# Patient Record
Sex: Male | Born: 1975 | Race: White | Hispanic: No | Marital: Married | Smoking: Never smoker
Health system: Southern US, Community
[De-identification: ages and names within clinical notes are randomized; demographics above are authoritative.]

## PROBLEM LIST (undated history)

## (undated) DIAGNOSIS — R51 Headache: Secondary | ICD-10-CM

## (undated) HISTORY — PX: NO PAST SURGERIES: SHX2092

---

## 2012-08-07 ENCOUNTER — Emergency Department (HOSPITAL_COMMUNITY)
Admission: EM | Admit: 2012-08-07 | Discharge: 2012-08-08 | Disposition: A | Discharge status: Home / Self Care | Payer: Self-pay | Attending: Emergency Medicine | Admitting: Emergency Medicine

## 2012-08-07 ENCOUNTER — Emergency Department (HOSPITAL_COMMUNITY): Payer: Self-pay

## 2012-08-07 ENCOUNTER — Encounter (HOSPITAL_COMMUNITY): Payer: Self-pay | Admitting: Emergency Medicine

## 2012-08-07 DIAGNOSIS — Y92410 Unspecified street and highway as the place of occurrence of the external cause: Secondary | ICD-10-CM | POA: Insufficient documentation

## 2012-08-07 DIAGNOSIS — S42001A Fracture of unspecified part of right clavicle, initial encounter for closed fracture: Secondary | ICD-10-CM

## 2012-08-07 DIAGNOSIS — Y9389 Activity, other specified: Secondary | ICD-10-CM | POA: Insufficient documentation

## 2012-08-07 DIAGNOSIS — S2239XA Fracture of one rib, unspecified side, initial encounter for closed fracture: Secondary | ICD-10-CM | POA: Insufficient documentation

## 2012-08-07 DIAGNOSIS — S42009A Fracture of unspecified part of unspecified clavicle, initial encounter for closed fracture: Secondary | ICD-10-CM | POA: Insufficient documentation

## 2012-08-07 DIAGNOSIS — S2231XA Fracture of one rib, right side, initial encounter for closed fracture: Secondary | ICD-10-CM

## 2012-08-07 MED ORDER — HYDROCODONE-ACETAMINOPHEN 5-325 MG PO TABS: 1.0000 | ORAL_TABLET | ORAL | Status: DC | PRN

## 2012-08-07 MED ORDER — MORPHINE SULFATE 4 MG/ML IJ SOLN
4.0000 mg | Freq: Once | INTRAMUSCULAR | Status: AC
  Administered 2012-08-07: 4 mg via INTRAVENOUS
  Filled 2012-08-07: qty 1
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
## 2012-08-13 ENCOUNTER — Encounter (HOSPITAL_COMMUNITY): Payer: Self-pay | Admitting: Pharmacy Technician

## 2012-08-13 ENCOUNTER — Encounter (HOSPITAL_COMMUNITY): Payer: Self-pay | Admitting: *Deleted
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
## 2012-08-14 ENCOUNTER — Ambulatory Visit (HOSPITAL_COMMUNITY): Payer: Medicaid Other

## 2012-08-14 ENCOUNTER — Encounter (HOSPITAL_COMMUNITY)
Admission: RE | Disposition: A | Discharge status: Home / Self Care | Payer: Self-pay | Source: Ambulatory Visit | Attending: Orthopedic Surgery

## 2012-08-14 ENCOUNTER — Ambulatory Visit (HOSPITAL_COMMUNITY)
Admission: RE | Admit: 2012-08-14 | Discharge: 2012-08-14 | Disposition: A | Discharge status: Home / Self Care | Payer: Medicaid Other | Source: Ambulatory Visit | Attending: Orthopedic Surgery | Admitting: Orthopedic Surgery

## 2012-08-14 ENCOUNTER — Encounter (HOSPITAL_COMMUNITY): Payer: Self-pay | Admitting: *Deleted

## 2012-08-14 ENCOUNTER — Ambulatory Visit (HOSPITAL_COMMUNITY): Payer: Medicaid Other | Admitting: Certified Registered Nurse Anesthetist

## 2012-08-14 ENCOUNTER — Encounter (HOSPITAL_COMMUNITY): Payer: Self-pay | Admitting: Certified Registered Nurse Anesthetist

## 2012-08-14 DIAGNOSIS — S42023A Displaced fracture of shaft of unspecified clavicle, initial encounter for closed fracture: Secondary | ICD-10-CM | POA: Insufficient documentation

## 2012-08-14 DIAGNOSIS — R51 Headache: Secondary | ICD-10-CM | POA: Insufficient documentation

## 2012-08-14 HISTORY — DX: Headache: R51

## 2012-08-14 HISTORY — PX: ORIF CLAVICULAR FRACTURE: SHX5055

## 2012-08-14 LAB — CBC
MCH: 29.3 pg (ref 26.0–34.0)
MCV: 81 fL (ref 78.0–100.0)
Platelets: 220 10*3/uL (ref 150–400)
RBC: 5.15 MIL/uL (ref 4.22–5.81)

## 2012-08-14 LAB — SURGICAL PCR SCREEN
MRSA, PCR: NEGATIVE
Staphylococcus aureus: NEGATIVE

## 2012-08-14 SURGERY — OPEN REDUCTION INTERNAL FIXATION (ORIF) CLAVICULAR FRACTURE
Anesthesia: General | Site: Shoulder | Laterality: Right | Wound class: Clean

## 2012-08-14 MED ORDER — HYDROMORPHONE HCL PF 1 MG/ML IJ SOLN
INTRAMUSCULAR | Status: AC
  Filled 2012-08-14: qty 1

## 2012-08-14 MED ORDER — MUPIROCIN 2 % EX OINT
TOPICAL_OINTMENT | CUTANEOUS | Status: AC
  Administered 2012-08-14: 1 via NASAL
  Filled 2012-08-14: qty 22

## 2012-08-14 MED ORDER — CEFAZOLIN SODIUM-DEXTROSE 2-3 GM-% IV SOLR
2.0000 g | INTRAVENOUS | Status: AC
  Administered 2012-08-14: 2 g via INTRAVENOUS

## 2012-08-14 MED ORDER — BUPIVACAINE-EPINEPHRINE PF 0.25-1:200000 % IJ SOLN
INTRAMUSCULAR | Status: AC
  Filled 2012-08-14: qty 30

## 2012-08-14 MED ORDER — MIDAZOLAM HCL 5 MG/5ML IJ SOLN
INTRAMUSCULAR | Status: DC | PRN
  Administered 2012-08-14: 2 mg via INTRAVENOUS

## 2012-08-14 MED ORDER — ONDANSETRON HCL 4 MG/2ML IJ SOLN: 4.0000 mg | Freq: Four times a day (QID) | INTRAMUSCULAR | Status: DC | PRN

## 2012-08-14 MED ORDER — PHENYLEPHRINE HCL 10 MG/ML IJ SOLN
INTRAMUSCULAR | Status: DC | PRN
  Administered 2012-08-14: 40 ug via INTRAVENOUS
  Administered 2012-08-14 (×4): 80 ug via INTRAVENOUS

## 2012-08-14 MED ORDER — OXYCODONE HCL 5 MG/5ML PO SOLN: 5.0000 mg | Freq: Once | ORAL | Status: AC | PRN

## 2012-08-14 MED ORDER — BUPIVACAINE-EPINEPHRINE 0.25% -1:200000 IJ SOLN
INTRAMUSCULAR | Status: DC | PRN
  Administered 2012-08-14: 7 mL

## 2012-08-14 MED ORDER — NEOSTIGMINE METHYLSULFATE 1 MG/ML IJ SOLN
INTRAMUSCULAR | Status: DC | PRN
  Administered 2012-08-14: 3 mg via INTRAVENOUS

## 2012-08-14 MED ORDER — GLYCOPYRROLATE 0.2 MG/ML IJ SOLN
INTRAMUSCULAR | Status: DC | PRN
  Administered 2012-08-14: 0.4 mg via INTRAVENOUS

## 2012-08-14 MED ORDER — ARTIFICIAL TEARS OP OINT
TOPICAL_OINTMENT | OPHTHALMIC | Status: DC | PRN
  Administered 2012-08-14: 1 via OPHTHALMIC

## 2012-08-14 MED ORDER — KETOROLAC TROMETHAMINE 30 MG/ML IJ SOLN
INTRAMUSCULAR | Status: DC | PRN
  Administered 2012-08-14: 30 mg via INTRAVENOUS

## 2012-08-14 MED ORDER — OXYCODONE HCL 5 MG PO TABS
ORAL_TABLET | ORAL | Status: AC
  Filled 2012-08-14: qty 1

## 2012-08-14 MED ORDER — MUPIROCIN 2 % EX OINT
TOPICAL_OINTMENT | Freq: Two times a day (BID) | CUTANEOUS | Status: DC
  Filled 2012-08-14: qty 22

## 2012-08-14 MED ORDER — METHOCARBAMOL 500 MG PO TABS: 500.0000 mg | ORAL_TABLET | Freq: Three times a day (TID) | ORAL | Status: DC | PRN

## 2012-08-14 MED ORDER — LACTATED RINGERS IV SOLN
INTRAVENOUS | Status: DC
  Administered 2012-08-14: 09:00:00 via INTRAVENOUS

## 2012-08-14 MED ORDER — CEFAZOLIN SODIUM-DEXTROSE 2-3 GM-% IV SOLR
INTRAVENOUS | Status: AC
  Filled 2012-08-14: qty 50

## 2012-08-14 MED ORDER — PROPOFOL 10 MG/ML IV BOLUS
INTRAVENOUS | Status: DC | PRN
  Administered 2012-08-14: 200 mg via INTRAVENOUS

## 2012-08-14 MED ORDER — LACTATED RINGERS IV SOLN
INTRAVENOUS | Status: DC | PRN
  Administered 2012-08-14 (×2): via INTRAVENOUS

## 2012-08-14 MED ORDER — HYDROMORPHONE HCL PF 1 MG/ML IJ SOLN
0.2500 mg | INTRAMUSCULAR | Status: DC | PRN
  Administered 2012-08-14 (×2): 0.5 mg via INTRAVENOUS

## 2012-08-14 MED ORDER — LIDOCAINE HCL (CARDIAC) 20 MG/ML IV SOLN
INTRAVENOUS | Status: DC | PRN
  Administered 2012-08-14: 60 mg via INTRAVENOUS

## 2012-08-14 MED ORDER — CHLORHEXIDINE GLUCONATE 4 % EX LIQD: 60.0000 mL | Freq: Once | CUTANEOUS | Status: DC

## 2012-08-14 MED ORDER — 0.9 % SODIUM CHLORIDE (POUR BTL) OPTIME
TOPICAL | Status: DC | PRN
  Administered 2012-08-14: 1000 mL

## 2012-08-14 MED ORDER — FENTANYL CITRATE 0.05 MG/ML IJ SOLN
INTRAMUSCULAR | Status: DC | PRN
  Administered 2012-08-14: 150 ug via INTRAVENOUS

## 2012-08-14 MED ORDER — OXYCODONE HCL 5 MG PO TABS
5.0000 mg | ORAL_TABLET | Freq: Once | ORAL | Status: AC | PRN
  Administered 2012-08-14: 5 mg via ORAL

## 2012-08-14 MED ORDER — OXYCODONE-ACETAMINOPHEN 5-325 MG PO TABS: 1.0000 | ORAL_TABLET | ORAL | Status: DC | PRN

## 2012-08-14 MED ORDER — ONDANSETRON HCL 4 MG/2ML IJ SOLN
INTRAMUSCULAR | Status: DC | PRN
  Administered 2012-08-14: 4 mg via INTRAVENOUS

## 2012-08-14 MED ORDER — ROCURONIUM BROMIDE 100 MG/10ML IV SOLN
INTRAVENOUS | Status: DC | PRN
  Administered 2012-08-14: 50 mg via INTRAVENOUS

## 2012-08-14 SURGICAL SUPPLY — 46 items
BIT DRILL 3.2 STERILE (BIT) ×1
BIT DRILL 3.2MM STRL (BIT) ×1 IMPLANT
BIT DRILL Q COUPLING 4.5 (BIT) IMPLANT
BIT DRILL Q/COUPLING 1 (BIT) IMPLANT
CLEANER TIP ELECTROSURG 2X2 (MISCELLANEOUS) ×2 IMPLANT
CLOTH BEACON ORANGE TIMEOUT ST (SAFETY) ×2 IMPLANT
DRAPE C-ARM 42X72 X-RAY (DRAPES) ×2 IMPLANT
DRAPE INCISE IOBAN 66X45 STRL (DRAPES) ×2 IMPLANT
DRAPE U-SHAPE 47X51 STRL (DRAPES) ×2 IMPLANT
DRILL BIT 3.2MM STERILE (BIT) ×1
DRSG EMULSION OIL 3X3 NADH (GAUZE/BANDAGES/DRESSINGS) ×2 IMPLANT
DURAPREP 26ML APPLICATOR (WOUND CARE) ×2 IMPLANT
ELECT NEEDLE TIP 2.8 STRL (NEEDLE) ×2 IMPLANT
ELECT REM PT RETURN 9FT ADLT (ELECTROSURGICAL) ×2
ELECTRODE REM PT RTRN 9FT ADLT (ELECTROSURGICAL) ×1 IMPLANT
GLOVE BIOGEL PI ORTHO PRO 7.5 (GLOVE) ×1
GLOVE BIOGEL PI ORTHO PRO SZ8 (GLOVE) ×1
GLOVE ORTHO TXT STRL SZ7.5 (GLOVE) ×2 IMPLANT
GLOVE PI ORTHO PRO STRL 7.5 (GLOVE) ×1 IMPLANT
GLOVE PI ORTHO PRO STRL SZ8 (GLOVE) ×1 IMPLANT
GLOVE SURG ORTHO 8.5 STRL (GLOVE) ×2 IMPLANT
GOWN STRL NON-REIN LRG LVL3 (GOWN DISPOSABLE) ×4 IMPLANT
GOWN STRL REIN XL XLG (GOWN DISPOSABLE) ×4 IMPLANT
KIT BASIN OR (CUSTOM PROCEDURE TRAY) ×2 IMPLANT
KIT ROOM TURNOVER OR (KITS) ×2 IMPLANT
MANIFOLD NEPTUNE II (INSTRUMENTS) ×2 IMPLANT
NEEDLE HYPO 25GX1X1/2 BEV (NEEDLE) ×2 IMPLANT
NS IRRIG 1000ML POUR BTL (IV SOLUTION) ×2 IMPLANT
PACK SHOULDER (CUSTOM PROCEDURE TRAY) ×2 IMPLANT
PAD ARMBOARD 7.5X6 YLW CONV (MISCELLANEOUS) ×4 IMPLANT
PIN CLAVICLE ASSEMBLY 3.0MM (Pin) ×2 IMPLANT
SLING ARM FOAM STRAP LRG (SOFTGOODS) ×2 IMPLANT
SLING ARM IMMOBILIZER LRG (SOFTGOODS) ×2 IMPLANT
SPONGE GAUZE 4X4 12PLY (GAUZE/BANDAGES/DRESSINGS) ×2 IMPLANT
SPONGE LAP 4X18 X RAY DECT (DISPOSABLE) ×4 IMPLANT
STRIP CLOSURE SKIN 1/2X4 (GAUZE/BANDAGES/DRESSINGS) ×2 IMPLANT
SUCTION FRAZIER TIP 10 FR DISP (SUCTIONS) ×2 IMPLANT
SUT MNCRL AB 4-0 PS2 18 (SUTURE) ×2 IMPLANT
SUT VIC AB 2-0 CT1 27 (SUTURE) ×1
SUT VIC AB 2-0 CT1 TAPERPNT 27 (SUTURE) ×1 IMPLANT
SUT VICRYL 0 CT 1 36IN (SUTURE) ×2 IMPLANT
SYR CONTROL 10ML LL (SYRINGE) ×2 IMPLANT
TAPE CLOTH SURG 4X10 WHT LF (GAUZE/BANDAGES/DRESSINGS) ×2 IMPLANT
TOWEL OR 17X24 6PK STRL BLUE (TOWEL DISPOSABLE) ×2 IMPLANT
TOWEL OR 17X26 10 PK STRL BLUE (TOWEL DISPOSABLE) ×2 IMPLANT
WATER STERILE IRR 1000ML POUR (IV SOLUTION) ×2 IMPLANT

## 2012-08-14 NOTE — Discharge Instructions (Signed)
Ice to the shoulder Do not get the incision wet in the shower until 5 days post op then ok to shower Do not lift push or pull with the right arm Use a sling as much as possible
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
## 2012-08-14 NOTE — Anesthesia Postprocedure Evaluation (Signed)
Anesthesia Post Note  Patient: Cole Armstrong  Procedure(s) Performed: Procedure(s) (LRB): OPEN REDUCTION INTERNAL FIXATION (ORIF) CLAVICULAR FRACTURE (Right)  Anesthesia type: General  Patient location: PACU  Post pain: Pain level controlled and Adequate analgesia  Post assessment: Post-op Vital signs reviewed, Patient's Cardiovascular Status Stable, Respiratory Function Stable, Patent Airway and Pain level controlled  Last Vitals:  Filed Vitals:   08/14/12 1215  BP: 108/71  Pulse: 58  Temp:   Resp: 16    Post vital signs: Reviewed and stable  Level of consciousness: awake, alert  and oriented  Complications: No apparent anesthesia complications

## 2012-08-14 NOTE — Preoperative (Signed)
Beta Blockers   Reason not to administer Beta Blockers:Not Applicable 

## 2012-08-14 NOTE — Interval H&P Note (Signed)
History and Physical Interval Note:  08/14/2012 10:30 AM  Cole Armstrong  has presented today for surgery, with the diagnosis of RIGHT CLAVICLE FRACTURE  The various methods of treatment have been discussed with the patient and family. After consideration of risks, benefits and other options for treatment, the patient has consented to  Procedure(s): OPEN REDUCTION INTERNAL FIXATION (ORIF) CLAVICULAR FRACTURE (Right) as a surgical intervention .  The patient's history has been reviewed, patient examined, no change in status, stable for surgery.  I have reviewed the patient's chart and labs.  Questions were answered to the patient's satisfaction.     Saoirse Legere,STEVEN R
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
## 2012-08-14 NOTE — Anesthesia Preprocedure Evaluation (Signed)
Anesthesia Evaluation  Patient identified by MRN, date of birth, ID band Patient awake    Reviewed: Allergy & Precautions, H&P , NPO status , Patient's Chart, lab work & pertinent test results  Airway Mallampati: II  Neck ROM: full    Dental   Pulmonary          Cardiovascular     Neuro/Psych  Headaches,    GI/Hepatic   Endo/Other    Renal/GU      Musculoskeletal   Abdominal   Peds  Hematology   Anesthesia Other Findings   Reproductive/Obstetrics                           Anesthesia Physical Anesthesia Plan  ASA: I  Anesthesia Plan: General   Post-op Pain Management:    Induction: Intravenous  Airway Management Planned: Oral ETT  Additional Equipment:   Intra-op Plan:   Post-operative Plan: Extubation in OR  Informed Consent: I have reviewed the patients History and Physical, chart, labs and discussed the procedure including the risks, benefits and alternatives for the proposed anesthesia with the patient or authorized representative who has indicated his/her understanding and acceptance.     Plan Discussed with: CRNA, Anesthesiologist and Surgeon  Anesthesia Plan Comments:         Anesthesia Quick Evaluation

## 2012-08-14 NOTE — Progress Notes (Signed)
Notified Dr. Jean Rosenthal pt. Has been sipping on water since midnight. Last sip was at 0700 and had a total of approx. 4 oz.

## 2012-08-14 NOTE — Transfer of Care (Signed)
Immediate Anesthesia Transfer of Care Note  Patient: Cole Armstrong  Procedure(s) Performed: Procedure(s): OPEN REDUCTION INTERNAL FIXATION (ORIF) CLAVICULAR FRACTURE (Right)  Patient Location: PACU  Anesthesia Type:General  Level of Consciousness: awake, alert , oriented and patient cooperative  Airway & Oxygen Therapy: Patient Spontanous Breathing  Post-op Assessment: Report given to PACU RN, Post -op Vital signs reviewed and stable and Patient moving all extremities X 4  Post vital signs: Reviewed and stable  Complications: No apparent anesthesia complications

## 2012-08-18 ENCOUNTER — Encounter (HOSPITAL_COMMUNITY): Payer: Self-pay | Admitting: Orthopedic Surgery

## 2014-12-07 IMAGING — RF DG CLAVICLE*R*
1 series · 2 of 2 positions shown · non-contrast
Comparison: 08/07/2012

CLINICAL DATA: Clavicle fracture

DG C-ARM 1-60 MIN,RIGHT CLAVICLE - 2+ VIEWS
TECHNIQUE: C-arm fluoroscopic images were obtained
intraoperatively and submitted for postoperative interpretation.
Please see the performing provider's procedural report for the
fluoroscopy time utilized.

[Series 1: run · 2 of 2 slices shown]
[im 1/2]
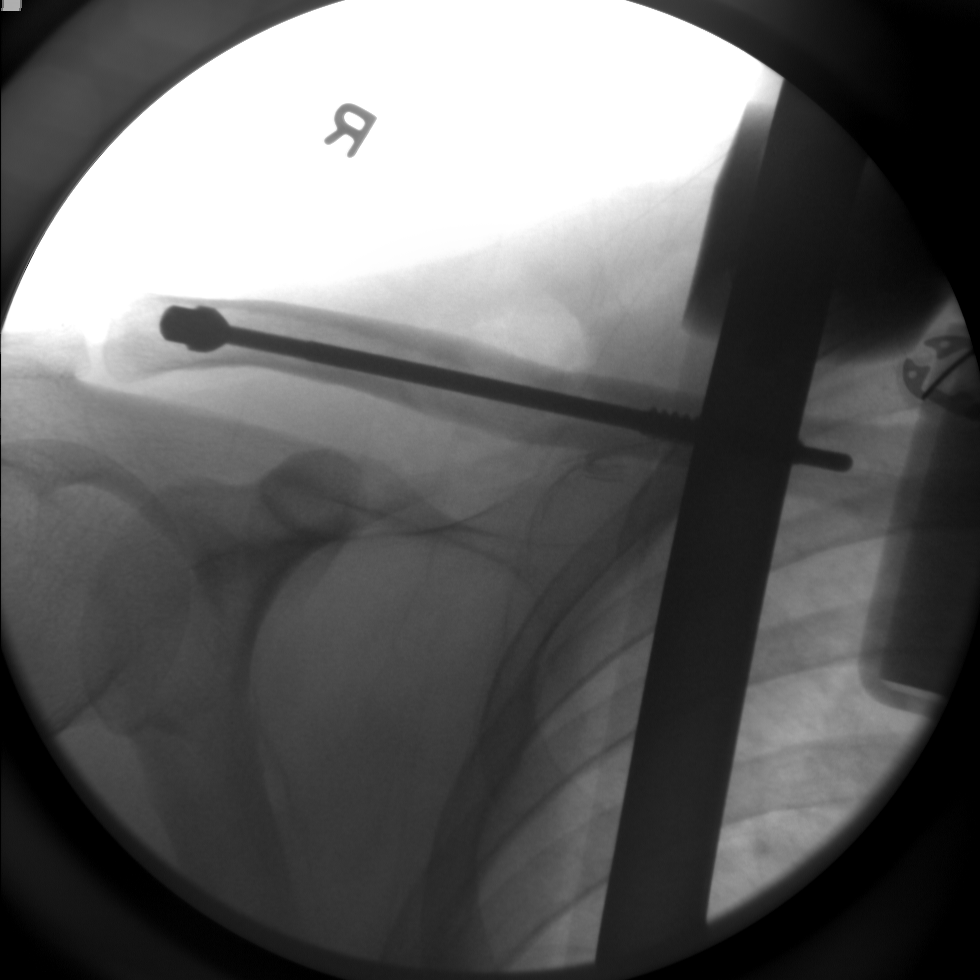
[im 2/2]
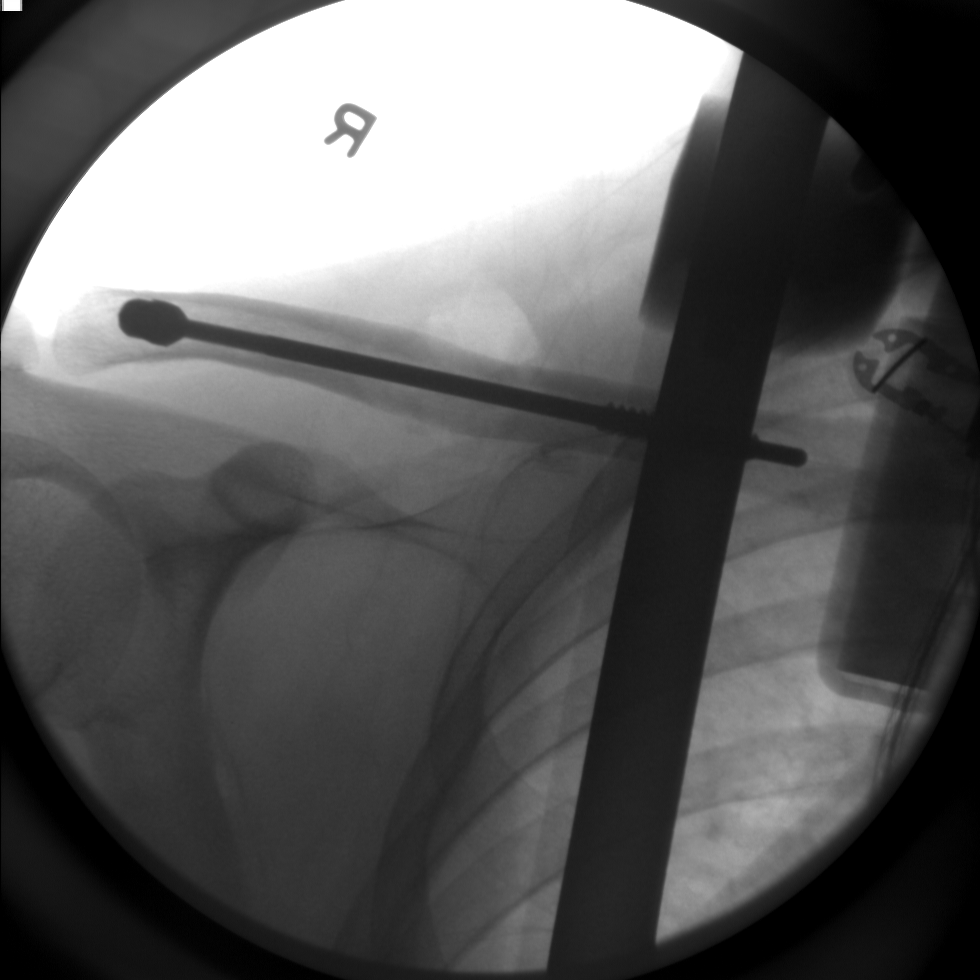

[2 of 2 positions shown; findings below may reference images not displayed]

FINDINGS: C-arm films document placement of an intramedullary rod
for stabilization of right clavicle fracture.  Improved position
and alignment.
IMPRESSION: As above.

## 2019-05-29 ENCOUNTER — Ambulatory Visit: Payer: BC Managed Care – PPO | Attending: Internal Medicine

## 2019-05-29 DIAGNOSIS — Z23 Encounter for immunization: Secondary | ICD-10-CM

## 2019-05-29 NOTE — Progress Notes (Signed)
   Covid-19 Vaccination Clinic  Name:  Cole Armstrong    MRN: [id number] DOB: 1975/04/09  05/29/2019  Cole Armstrong was observed post Covid-19 immunization for 15 minutes without incident. He was provided with Vaccine Information Sheet and instruction to access the V-Safe system.   Cole Armstrong was instructed to call 911 with any severe reactions post vaccine: Marland Kitchen Difficulty breathing  . Swelling of face and throat  . A fast heartbeat  . A bad rash all over body  . Dizziness and weakness   Immunizations Administered    Name Date Dose VIS Date Route   Pfizer COVID-19 Vaccine 05/29/2019 11:50 AM 0.3 mL 02/05/2019 Intramuscular   Manufacturer: [employer], [employer]   Lot: [id number]   NDC: [address]-2

## 2019-06-23 ENCOUNTER — Ambulatory Visit: Payer: BC Managed Care – PPO | Attending: Internal Medicine

## 2019-06-23 DIAGNOSIS — Z23 Encounter for immunization: Secondary | ICD-10-CM

## 2019-06-23 NOTE — Progress Notes (Signed)
   Covid-19 Vaccination Clinic  Name:  Cole Armstrong    MRN: [id number] DOB: 04/11/1975  06/23/2019  Cole Armstrong was observed post Covid-19 immunization for 15 minutes without incident. He was provided with Vaccine Information Sheet and instruction to access the V-Safe system.   Cole Armstrong was instructed to call 911 with any severe reactions post vaccine: Marland Kitchen Difficulty breathing  . Swelling of face and throat  . A fast heartbeat  . A bad rash all over body  . Dizziness and weakness   Immunizations Administered    Name Date Dose VIS Date Route   Pfizer COVID-19 Vaccine 06/23/2019  8:43 AM 0.3 mL 04/21/2018 Intramuscular   Manufacturer: [employer], [employer]   Lot: [id number]   NDC: [address]-1

## 2022-10-08 ENCOUNTER — Other Ambulatory Visit: Payer: Self-pay | Admitting: Otolaryngology

## 2022-10-08 DIAGNOSIS — H9120 Sudden idiopathic hearing loss, unspecified ear: Secondary | ICD-10-CM

## 2022-10-08 DIAGNOSIS — H903 Sensorineural hearing loss, bilateral: Secondary | ICD-10-CM

## 2022-11-06 ENCOUNTER — Ambulatory Visit
Admission: RE | Admit: 2022-11-06 | Discharge: 2022-11-06 | Disposition: A | Discharge status: Home / Self Care | Payer: BC Managed Care – PPO | Source: Ambulatory Visit | Attending: Otolaryngology | Admitting: Otolaryngology

## 2022-11-06 DIAGNOSIS — H9120 Sudden idiopathic hearing loss, unspecified ear: Secondary | ICD-10-CM

## 2022-11-06 DIAGNOSIS — H903 Sensorineural hearing loss, bilateral: Secondary | ICD-10-CM

## 2022-11-06 MED ORDER — GADOPICLENOL 0.5 MMOL/ML IV SOLN
7.5000 mL | Freq: Once | INTRAVENOUS | Status: AC | PRN
  Administered 2022-11-06: 7.5 mL via INTRAVENOUS

## 2022-11-17 ENCOUNTER — Other Ambulatory Visit: Payer: BC Managed Care – PPO

## 2023-03-02 ENCOUNTER — Telehealth: Payer: Self-pay | Admitting: Neurology
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
## 2023-03-03 ENCOUNTER — Ambulatory Visit: Payer: BC Managed Care – PPO | Admitting: Neurology

## 2023-03-03 NOTE — Telephone Encounter (Addendum)
 Contacted pt to r/s appt. Pt accepted appt 03/03/22 at 22m.

## 2023-03-04 ENCOUNTER — Encounter: Payer: Self-pay | Admitting: Neurology

## 2023-03-04 ENCOUNTER — Ambulatory Visit: Payer: BC Managed Care – PPO | Admitting: Neurology

## 2023-03-04 VITALS — BP 142/99 | HR 74 | Ht 70.0 in | Wt 179.0 lb

## 2023-03-04 DIAGNOSIS — G43E09 Chronic migraine with aura, not intractable, without status migrainosus: Secondary | ICD-10-CM | POA: Insufficient documentation

## 2023-03-04 MED ORDER — ONDANSETRON 4 MG PO TBDP: 4.0000 mg | ORAL_TABLET | Freq: Three times a day (TID) | ORAL | 6 refills | Status: AC | PRN

## 2023-03-04 MED ORDER — ONDANSETRON 4 MG PO TBDP: 4.0000 mg | ORAL_TABLET | Freq: Three times a day (TID) | ORAL | 6 refills | Status: DC | PRN

## 2023-03-04 MED ORDER — RIZATRIPTAN BENZOATE 10 MG PO TBDP: 10.0000 mg | ORAL_TABLET | ORAL | 11 refills | Status: DC | PRN

## 2023-03-04 MED ORDER — RIZATRIPTAN BENZOATE 10 MG PO TBDP: 10.0000 mg | ORAL_TABLET | ORAL | 11 refills | Status: AC | PRN

## 2023-03-04 NOTE — Progress Notes (Signed)
 Chief Complaint  Patient presents with   New Patient (Initial Visit)    Rm14, alone, referral from [facility]/Steven Hoshal MD [phone] for migraines: 2 in past 30 days but reported aura really bad. Pt stated ent stated that they saw something on recent mri which triggered them to send referral here.       ASSESSMENT AND PLAN  Cole Armstrong is a 48 y.o. male   Chronic migraine headaches with visual aura  Maxalt  as needed, may combine Zofran , Aleve for prolonged severe headaches    DIAGNOSTIC DATA (LABS, IMAGING, TESTING) - I reviewed patient records, labs, notes, testing and imaging myself where available.   MEDICAL HISTORY:  Cole Armstrong, is a 48 year old male seen in request by Dr. Luciano Standing, for evaluation of headache, initial evaluation was on March 04, 2023 [place],  [place] [address], Patient, No Pcp Per   History is obtained from the patient and review of electronic medical records. I personally reviewed pertinent available imaging films in PACS.   PMHx of   He has history of chronic migraine over the past few years, often triggered by season change, bright light, proceeding by visual aura, tunnel vision, sometimes flashing light in his visual field, lasting for 30 minutes, then followed by severe pounding headache with light, noise sensitivity, nauseous, can lasting for few hours, Tylenol  PM as needed and sleep helps, but not always, have never tried prescription abortive medication  He seems to have increased headache over the past few months, had two severe prolonged headache over past 30 days MRI of the brain with without contrast September 2024 showed mild small vessel disease no acute abnormality PHYSICAL EXAM:   Vitals:   03/04/23 1012  BP: (!) 142/99  Pulse: 74  Weight: 179 lb (81.2 kg)  Height: 5' 10 (1.778 m)   Not recorded     Body mass index is 25.68 kg/m.  PHYSICAL EXAMNIATION:  Gen: NAD, conversant, well nourised, well groomed                      Cardiovascular: Regular rate rhythm, no peripheral edema, warm, nontender. Eyes: Conjunctivae clear without exudates or hemorrhage Neck: Supple, no carotid bruits. Pulmonary: Clear to auscultation bilaterally   NEUROLOGICAL EXAM:  MENTAL STATUS: Speech/cognition: Awake, alert, oriented to history taking and casual conversation CRANIAL NERVES: CN II: Visual fields are full to confrontation. Pupils are round equal and briskly reactive to light. CN III, IV, VI: extraocular movement are normal. No ptosis. CN V: Facial sensation is intact to light touch CN VII: Face is symmetric with normal eye closure  CN VIII: Hearing is normal to causal conversation. CN IX, X: Phonation is normal. CN XI: Head turning and shoulder shrug are intact  MOTOR: There is no pronator drift of out-stretched arms. Muscle bulk and tone are normal. Muscle strength is normal.  REFLEXES: Reflexes are 2+ and symmetric at the biceps, triceps, knees, and ankles. Plantar responses are flexor.  SENSORY: Intact to light touch, pinprick and vibratory sensation are intact in fingers and toes.  COORDINATION: There is no trunk or limb dysmetria noted.  GAIT/STANCE: Posture is normal. Gait is steady with normal steps, base, arm swing, and turning. Heel and toe walking are normal. Tandem gait is normal.  Romberg is absent.  REVIEW OF SYSTEMS:  Full 14 system review of systems performed and notable only for as above All other review of systems were negative.   ALLERGIES: No Known Allergies  HOME MEDICATIONS: Current Outpatient Medications  Medication Sig Dispense Refill   naproxen (NAPROSYN) 500 MG tablet Take 500 mg by mouth daily as needed.     No current facility-administered medications for this visit.    PAST MEDICAL HISTORY: Past Medical History:  Diagnosis Date   Headache(784.0)     PAST SURGICAL HISTORY: Past Surgical History:  Procedure Laterality Date   NO PAST SURGERIES      ORIF CLAVICULAR FRACTURE Right 08/14/2012   Procedure: OPEN REDUCTION INTERNAL FIXATION (ORIF) CLAVICULAR FRACTURE;  Surgeon: Elspeth JONELLE Her, MD;  Location: [facility] OR;  Service: Orthopedics;  Laterality: Right;    FAMILY HISTORY: History reviewed. No pertinent family history.  SOCIAL HISTORY: Social History   Socioeconomic History   Marital status: Married    Spouse name: Not on file   Number of children: 1   Years of education: Not on file   Highest education level: 12th grade  Occupational History   Not on file  Tobacco Use   Smoking status: Never   Smokeless tobacco: Never  Vaping Use   Vaping status: Every Day   Substances: THC, CBD  Substance and Sexual Activity   Alcohol use: Yes    Alcohol/week: 8.0 standard drinks of alcohol    Types: 8 Standard drinks or equivalent per week    Comment: seldom   Drug use: Yes    Types: Marijuana   Sexual activity: Yes    Birth control/protection: None  Other Topics Concern   Not on file  Social History Narrative   Not on file   Social Drivers of Health   Financial Resource Strain: Not on file  Food Insecurity: Low Risk  (10/17/2022)   Received from [facility]   Hunger Vital Sign    Worried About Running Out of Food in the Last Year: Never true    Ran Out of Food in the Last Year: Never true  Transportation Needs: No Transportation Needs (10/17/2022)   Received from [facility]    In the past 12 months, has lack of reliable transportation kept you from medical appointments, meetings, work or from getting things needed for daily living? : No  Physical Activity: Not on file  Stress: Not on file  Social Connections: Not on file  Intimate Partner Violence: Not on file      Modena Callander, M.D. Ph.D.  [facility] [address], [address] [place], [place] [address] Ph: [phone] Fax: [phone]  CC:  Luciano Elspeth, MD [address] [place],  [place] [address]  Patient, No Pcp  Per
